# Patient Record
Sex: Male | Born: 1980
Health system: Southern US, Community
[De-identification: ages and names within clinical notes are randomized; demographics above are authoritative.]

## PROBLEM LIST (undated history)

## (undated) DIAGNOSIS — Z21 Asymptomatic human immunodeficiency virus [HIV] infection status: Secondary | ICD-10-CM

---

## 2017-11-29 DIAGNOSIS — M8580 Other specified disorders of bone density and structure, unspecified site: Secondary | ICD-10-CM | POA: Diagnosis not present

## 2017-11-29 DIAGNOSIS — A529 Late syphilis, unspecified: Secondary | ICD-10-CM | POA: Diagnosis not present

## 2017-11-29 DIAGNOSIS — B20 Human immunodeficiency virus [HIV] disease: Secondary | ICD-10-CM | POA: Diagnosis not present

## 2017-12-29 DIAGNOSIS — M76822 Posterior tibial tendinitis, left leg: Secondary | ICD-10-CM | POA: Diagnosis not present

## 2017-12-29 DIAGNOSIS — M2140 Flat foot [pes planus] (acquired), unspecified foot: Secondary | ICD-10-CM | POA: Diagnosis not present

## 2017-12-29 DIAGNOSIS — M65872 Other synovitis and tenosynovitis, left ankle and foot: Secondary | ICD-10-CM | POA: Diagnosis not present

## 2018-02-23 DIAGNOSIS — B20 Human immunodeficiency virus [HIV] disease: Secondary | ICD-10-CM | POA: Diagnosis not present

## 2018-02-23 DIAGNOSIS — M8580 Other specified disorders of bone density and structure, unspecified site: Secondary | ICD-10-CM | POA: Diagnosis not present

## 2018-02-23 DIAGNOSIS — I10 Essential (primary) hypertension: Secondary | ICD-10-CM | POA: Diagnosis not present

## 2018-02-24 DIAGNOSIS — F17290 Nicotine dependence, other tobacco product, uncomplicated: Secondary | ICD-10-CM | POA: Diagnosis not present

## 2018-02-24 DIAGNOSIS — G43909 Migraine, unspecified, not intractable, without status migrainosus: Secondary | ICD-10-CM | POA: Diagnosis not present

## 2018-02-24 DIAGNOSIS — Z6833 Body mass index (BMI) 33.0-33.9, adult: Secondary | ICD-10-CM | POA: Diagnosis not present

## 2018-02-24 DIAGNOSIS — I10 Essential (primary) hypertension: Secondary | ICD-10-CM | POA: Diagnosis not present

## 2018-05-25 DIAGNOSIS — B20 Human immunodeficiency virus [HIV] disease: Secondary | ICD-10-CM | POA: Diagnosis not present

## 2018-05-25 DIAGNOSIS — M8580 Other specified disorders of bone density and structure, unspecified site: Secondary | ICD-10-CM | POA: Diagnosis not present

## 2018-05-25 DIAGNOSIS — R768 Other specified abnormal immunological findings in serum: Secondary | ICD-10-CM | POA: Diagnosis not present

## 2018-05-25 DIAGNOSIS — I10 Essential (primary) hypertension: Secondary | ICD-10-CM | POA: Diagnosis not present

## 2018-09-07 DIAGNOSIS — B20 Human immunodeficiency virus [HIV] disease: Secondary | ICD-10-CM | POA: Diagnosis not present

## 2018-11-03 DIAGNOSIS — I10 Essential (primary) hypertension: Secondary | ICD-10-CM | POA: Diagnosis not present

## 2018-11-03 DIAGNOSIS — F4310 Post-traumatic stress disorder, unspecified: Secondary | ICD-10-CM | POA: Diagnosis not present

## 2018-11-03 DIAGNOSIS — E559 Vitamin D deficiency, unspecified: Secondary | ICD-10-CM | POA: Diagnosis not present

## 2018-11-03 DIAGNOSIS — E669 Obesity, unspecified: Secondary | ICD-10-CM | POA: Diagnosis not present

## 2018-11-03 DIAGNOSIS — Z1159 Encounter for screening for other viral diseases: Secondary | ICD-10-CM | POA: Diagnosis not present

## 2018-11-03 DIAGNOSIS — Z21 Asymptomatic human immunodeficiency virus [HIV] infection status: Secondary | ICD-10-CM | POA: Diagnosis not present

## 2018-11-03 DIAGNOSIS — Z713 Dietary counseling and surveillance: Secondary | ICD-10-CM | POA: Diagnosis not present

## 2018-11-03 DIAGNOSIS — Z6834 Body mass index (BMI) 34.0-34.9, adult: Secondary | ICD-10-CM | POA: Diagnosis not present

## 2019-03-02 DIAGNOSIS — Z119 Encounter for screening for infectious and parasitic diseases, unspecified: Secondary | ICD-10-CM | POA: Diagnosis not present

## 2019-03-02 DIAGNOSIS — Z13228 Encounter for screening for other metabolic disorders: Secondary | ICD-10-CM | POA: Diagnosis not present

## 2019-04-04 DIAGNOSIS — B20 Human immunodeficiency virus [HIV] disease: Secondary | ICD-10-CM | POA: Diagnosis not present

## 2019-04-04 DIAGNOSIS — M25375 Other instability, left foot: Secondary | ICD-10-CM | POA: Diagnosis not present

## 2019-04-04 DIAGNOSIS — M25552 Pain in left hip: Secondary | ICD-10-CM | POA: Diagnosis not present

## 2019-04-04 DIAGNOSIS — K219 Gastro-esophageal reflux disease without esophagitis: Secondary | ICD-10-CM | POA: Diagnosis not present

## 2019-05-29 DIAGNOSIS — F4310 Post-traumatic stress disorder, unspecified: Secondary | ICD-10-CM | POA: Diagnosis not present

## 2019-05-29 DIAGNOSIS — G4700 Insomnia, unspecified: Secondary | ICD-10-CM | POA: Diagnosis not present

## 2019-05-29 DIAGNOSIS — F419 Anxiety disorder, unspecified: Secondary | ICD-10-CM | POA: Diagnosis not present

## 2019-06-01 DIAGNOSIS — B20 Human immunodeficiency virus [HIV] disease: Secondary | ICD-10-CM | POA: Diagnosis not present

## 2019-06-01 DIAGNOSIS — M8580 Other specified disorders of bone density and structure, unspecified site: Secondary | ICD-10-CM | POA: Diagnosis not present

## 2019-06-01 DIAGNOSIS — I10 Essential (primary) hypertension: Secondary | ICD-10-CM | POA: Diagnosis not present

## 2019-06-25 DIAGNOSIS — S8002XA Contusion of left knee, initial encounter: Secondary | ICD-10-CM | POA: Diagnosis not present

## 2019-06-25 DIAGNOSIS — S060X0A Concussion without loss of consciousness, initial encounter: Secondary | ICD-10-CM | POA: Diagnosis not present

## 2019-07-02 DIAGNOSIS — I10 Essential (primary) hypertension: Secondary | ICD-10-CM | POA: Diagnosis not present

## 2019-07-02 DIAGNOSIS — Z7289 Other problems related to lifestyle: Secondary | ICD-10-CM | POA: Diagnosis not present

## 2019-07-02 DIAGNOSIS — G4733 Obstructive sleep apnea (adult) (pediatric): Secondary | ICD-10-CM | POA: Diagnosis not present

## 2019-07-02 DIAGNOSIS — K219 Gastro-esophageal reflux disease without esophagitis: Secondary | ICD-10-CM | POA: Diagnosis not present

## 2019-07-13 ENCOUNTER — Other Ambulatory Visit (HOSPITAL_BASED_OUTPATIENT_CLINIC_OR_DEPARTMENT_OTHER): Payer: Self-pay

## 2019-07-13 DIAGNOSIS — G4733 Obstructive sleep apnea (adult) (pediatric): Secondary | ICD-10-CM

## 2019-07-13 DIAGNOSIS — R0683 Snoring: Secondary | ICD-10-CM

## 2019-07-13 DIAGNOSIS — R5383 Other fatigue: Secondary | ICD-10-CM

## 2019-07-13 DIAGNOSIS — G4710 Hypersomnia, unspecified: Secondary | ICD-10-CM

## 2019-07-17 DIAGNOSIS — Z800 Family history of malignant neoplasm of digestive organs: Secondary | ICD-10-CM | POA: Diagnosis not present

## 2019-07-17 DIAGNOSIS — K219 Gastro-esophageal reflux disease without esophagitis: Secondary | ICD-10-CM | POA: Diagnosis not present

## 2019-07-17 DIAGNOSIS — R1310 Dysphagia, unspecified: Secondary | ICD-10-CM | POA: Diagnosis not present

## 2019-07-17 DIAGNOSIS — D509 Iron deficiency anemia, unspecified: Secondary | ICD-10-CM | POA: Diagnosis not present

## 2019-07-23 DIAGNOSIS — B3781 Candidal esophagitis: Secondary | ICD-10-CM | POA: Diagnosis not present

## 2019-07-23 DIAGNOSIS — R1310 Dysphagia, unspecified: Secondary | ICD-10-CM | POA: Diagnosis not present

## 2019-07-23 DIAGNOSIS — K219 Gastro-esophageal reflux disease without esophagitis: Secondary | ICD-10-CM | POA: Diagnosis not present

## 2019-07-23 DIAGNOSIS — Z800 Family history of malignant neoplasm of digestive organs: Secondary | ICD-10-CM | POA: Diagnosis not present

## 2019-08-08 ENCOUNTER — Other Ambulatory Visit: Payer: Self-pay

## 2019-08-08 ENCOUNTER — Ambulatory Visit (HOSPITAL_BASED_OUTPATIENT_CLINIC_OR_DEPARTMENT_OTHER): Payer: Federal, State, Local not specified - PPO | Attending: Internal Medicine | Admitting: Internal Medicine

## 2019-08-08 DIAGNOSIS — G4710 Hypersomnia, unspecified: Secondary | ICD-10-CM

## 2019-08-08 DIAGNOSIS — G4733 Obstructive sleep apnea (adult) (pediatric): Secondary | ICD-10-CM | POA: Diagnosis not present

## 2019-08-08 DIAGNOSIS — R5383 Other fatigue: Secondary | ICD-10-CM | POA: Diagnosis not present

## 2019-08-08 DIAGNOSIS — R0683 Snoring: Secondary | ICD-10-CM

## 2019-08-11 DIAGNOSIS — G4733 Obstructive sleep apnea (adult) (pediatric): Secondary | ICD-10-CM

## 2019-08-11 NOTE — Procedures (Signed)
   Patient Name: Wesley Hill, Wesley Hill Date: 08/09/2019 Gender: Male D.O.B: 30-Jan-1981 Age (years): 37 Referring Provider: Latanya Presser MD Height (inches): 9 Interpreting Physician: Baird Lyons MD, ABSM Weight (lbs): 185 RPSGT: Jonna Coup BMI: 29 MRN: [id number] Neck Size: 15.50  CLINICAL INFORMATION Sleep Study Type: HST Indication for sleep study: Excessive Daytime Sleepiness, Fatigue, OSA, Snoring Epworth Sleepiness Score: 15  SLEEP STUDY TECHNIQUE A multi-channel overnight portable sleep study was performed. The channels recorded were: nasal airflow, thoracic respiratory movement, and oxygen saturation with a pulse oximetry. Snoring was also monitored.  MEDICATIONS Patient self administered medications include: none reported.  SLEEP ARCHITECTURE Patient was studied for 358.3 minutes. The sleep efficiency was 98.2 % and the patient was supine for 51%. The arousal index was 0.0 per hour.  RESPIRATORY PARAMETERS The overall AHI was 1.0 per hour, with a central apnea index of 0.0 per hour. The oxygen nadir was 90% during sleep.  CARDIAC DATA Mean heart rate during sleep was 54.2 bpm.  IMPRESSIONS - No significant obstructive sleep apnea occurred during this study (AHI = 1.0/h). - No significant central sleep apnea occurred during this study (CAI = 0.0/h). - The patient had minimal or no oxygen desaturation during the study (Min O2 = 90%) - Patient snored.  DIAGNOSIS - Normal study  RECOMMENDATIONS - Be careful with alcohol, sedatives and other CNS depressants that may worsen sleep apnea and disrupt normal sleep architecture. - Sleep hygiene should be reviewed to assess factors that may improve sleep quality. - Weight management and regular exercise should be initiated or continued. - Consider repeat HST or in-lab study if test results are discordant with clinical impresion.  [Electronically signed] 08/11/2019 11:04 AM  Baird Lyons MD, ABSM Diplomate,  American Board of Sleep Medicine   NPI: [id number]                          [place] [place] [facility]  ELECTRONICALLY SIGNED ON:  08/11/2019, 11:01 AM [place] PH: [phone]   FX: [phone] [place]

## 2019-08-14 DIAGNOSIS — M2142 Flat foot [pes planus] (acquired), left foot: Secondary | ICD-10-CM | POA: Diagnosis not present

## 2019-08-14 DIAGNOSIS — M25572 Pain in left ankle and joints of left foot: Secondary | ICD-10-CM | POA: Diagnosis not present

## 2019-08-14 DIAGNOSIS — B353 Tinea pedis: Secondary | ICD-10-CM | POA: Diagnosis not present

## 2019-08-23 DIAGNOSIS — K200 Eosinophilic esophagitis: Secondary | ICD-10-CM | POA: Diagnosis not present

## 2019-08-23 DIAGNOSIS — K219 Gastro-esophageal reflux disease without esophagitis: Secondary | ICD-10-CM | POA: Diagnosis not present

## 2019-08-30 DIAGNOSIS — R768 Other specified abnormal immunological findings in serum: Secondary | ICD-10-CM | POA: Diagnosis not present

## 2019-08-30 DIAGNOSIS — Z79899 Other long term (current) drug therapy: Secondary | ICD-10-CM | POA: Diagnosis not present

## 2019-08-30 DIAGNOSIS — B20 Human immunodeficiency virus [HIV] disease: Secondary | ICD-10-CM | POA: Diagnosis not present

## 2019-08-30 DIAGNOSIS — Z8619 Personal history of other infectious and parasitic diseases: Secondary | ICD-10-CM | POA: Diagnosis not present

## 2019-09-05 DIAGNOSIS — M65872 Other synovitis and tenosynovitis, left ankle and foot: Secondary | ICD-10-CM | POA: Diagnosis not present

## 2019-09-05 DIAGNOSIS — S93312A Subluxation of tarsal joint of left foot, initial encounter: Secondary | ICD-10-CM | POA: Diagnosis not present

## 2019-09-05 DIAGNOSIS — M24152 Other articular cartilage disorders, left hip: Secondary | ICD-10-CM | POA: Diagnosis not present

## 2019-09-05 DIAGNOSIS — M259 Joint disorder, unspecified: Secondary | ICD-10-CM | POA: Diagnosis not present

## 2019-09-05 DIAGNOSIS — M85872 Other specified disorders of bone density and structure, left ankle and foot: Secondary | ICD-10-CM | POA: Diagnosis not present

## 2019-09-05 DIAGNOSIS — M542 Cervicalgia: Secondary | ICD-10-CM | POA: Diagnosis not present

## 2019-09-05 DIAGNOSIS — M549 Dorsalgia, unspecified: Secondary | ICD-10-CM | POA: Diagnosis not present

## 2019-09-05 DIAGNOSIS — R600 Localized edema: Secondary | ICD-10-CM | POA: Diagnosis not present

## 2019-09-05 DIAGNOSIS — M25472 Effusion, left ankle: Secondary | ICD-10-CM | POA: Diagnosis not present

## 2019-09-05 DIAGNOSIS — M419 Scoliosis, unspecified: Secondary | ICD-10-CM | POA: Diagnosis not present

## 2019-09-05 DIAGNOSIS — M25552 Pain in left hip: Secondary | ICD-10-CM | POA: Diagnosis not present

## 2019-09-05 DIAGNOSIS — M160 Bilateral primary osteoarthritis of hip: Secondary | ICD-10-CM | POA: Diagnosis not present

## 2019-09-05 DIAGNOSIS — M899 Disorder of bone, unspecified: Secondary | ICD-10-CM | POA: Diagnosis not present

## 2020-01-18 DIAGNOSIS — Z1389 Encounter for screening for other disorder: Secondary | ICD-10-CM | POA: Diagnosis not present

## 2020-01-18 DIAGNOSIS — B20 Human immunodeficiency virus [HIV] disease: Secondary | ICD-10-CM | POA: Diagnosis not present

## 2020-02-05 ENCOUNTER — Other Ambulatory Visit: Payer: Self-pay | Admitting: Internal Medicine

## 2020-02-05 DIAGNOSIS — R1031 Right lower quadrant pain: Secondary | ICD-10-CM

## 2020-02-05 DIAGNOSIS — B20 Human immunodeficiency virus [HIV] disease: Secondary | ICD-10-CM | POA: Diagnosis not present

## 2020-02-05 DIAGNOSIS — I10 Essential (primary) hypertension: Secondary | ICD-10-CM | POA: Diagnosis not present

## 2020-02-05 DIAGNOSIS — R109 Unspecified abdominal pain: Secondary | ICD-10-CM | POA: Diagnosis not present

## 2020-02-05 DIAGNOSIS — R3129 Other microscopic hematuria: Secondary | ICD-10-CM | POA: Diagnosis not present

## 2020-02-06 ENCOUNTER — Ambulatory Visit
Admission: RE | Admit: 2020-02-06 | Discharge: 2020-02-06 | Disposition: A | Discharge status: Home / Self Care | Payer: Federal, State, Local not specified - PPO | Source: Ambulatory Visit | Attending: Internal Medicine | Admitting: Internal Medicine

## 2020-02-06 DIAGNOSIS — R1031 Right lower quadrant pain: Secondary | ICD-10-CM

## 2020-02-06 MED ORDER — IOPAMIDOL (ISOVUE-300) INJECTION 61%
125.0000 mL | Freq: Once | INTRAVENOUS | Status: AC | PRN
  Administered 2020-02-06: 13:00:00 125 mL via INTRAVENOUS

## 2020-02-07 DIAGNOSIS — Z7252 High risk homosexual behavior: Secondary | ICD-10-CM | POA: Diagnosis not present

## 2020-02-07 DIAGNOSIS — B20 Human immunodeficiency virus [HIV] disease: Secondary | ICD-10-CM | POA: Diagnosis not present

## 2020-02-07 DIAGNOSIS — K6289 Other specified diseases of anus and rectum: Secondary | ICD-10-CM | POA: Diagnosis not present

## 2020-02-12 DIAGNOSIS — B20 Human immunodeficiency virus [HIV] disease: Secondary | ICD-10-CM | POA: Diagnosis not present

## 2020-02-12 DIAGNOSIS — K625 Hemorrhage of anus and rectum: Secondary | ICD-10-CM | POA: Diagnosis not present

## 2020-02-12 DIAGNOSIS — R194 Change in bowel habit: Secondary | ICD-10-CM | POA: Diagnosis not present

## 2020-02-12 DIAGNOSIS — R933 Abnormal findings on diagnostic imaging of other parts of digestive tract: Secondary | ICD-10-CM | POA: Diagnosis not present

## 2020-02-15 DIAGNOSIS — Z1211 Encounter for screening for malignant neoplasm of colon: Secondary | ICD-10-CM | POA: Diagnosis not present

## 2020-02-15 DIAGNOSIS — R194 Change in bowel habit: Secondary | ICD-10-CM | POA: Diagnosis not present

## 2020-02-15 DIAGNOSIS — K625 Hemorrhage of anus and rectum: Secondary | ICD-10-CM | POA: Diagnosis not present

## 2020-02-15 DIAGNOSIS — R933 Abnormal findings on diagnostic imaging of other parts of digestive tract: Secondary | ICD-10-CM | POA: Diagnosis not present

## 2020-02-22 DIAGNOSIS — R519 Headache, unspecified: Secondary | ICD-10-CM | POA: Diagnosis not present

## 2020-04-17 DIAGNOSIS — M8580 Other specified disorders of bone density and structure, unspecified site: Secondary | ICD-10-CM | POA: Diagnosis not present

## 2020-04-17 DIAGNOSIS — Z8619 Personal history of other infectious and parasitic diseases: Secondary | ICD-10-CM | POA: Diagnosis not present

## 2020-04-17 DIAGNOSIS — B20 Human immunodeficiency virus [HIV] disease: Secondary | ICD-10-CM | POA: Diagnosis not present

## 2020-04-17 DIAGNOSIS — R768 Other specified abnormal immunological findings in serum: Secondary | ICD-10-CM | POA: Diagnosis not present

## 2020-04-17 DIAGNOSIS — Z79899 Other long term (current) drug therapy: Secondary | ICD-10-CM | POA: Diagnosis not present

## 2020-10-30 DIAGNOSIS — B20 Human immunodeficiency virus [HIV] disease: Secondary | ICD-10-CM | POA: Diagnosis not present

## 2020-10-30 DIAGNOSIS — M8580 Other specified disorders of bone density and structure, unspecified site: Secondary | ICD-10-CM | POA: Diagnosis not present

## 2020-10-30 DIAGNOSIS — I10 Essential (primary) hypertension: Secondary | ICD-10-CM | POA: Diagnosis not present

## 2020-10-30 DIAGNOSIS — N289 Disorder of kidney and ureter, unspecified: Secondary | ICD-10-CM | POA: Diagnosis not present

## 2020-11-25 IMAGING — CT CT ABD-PELV W/ CM
2 of 4 series · 14 of 46 positions shown, 16 images · IV contrast (iopamidol)
Comparison: None.

CLINICAL DATA: Right lower quadrant pain, suprapubic pain, pain and
cramping for 3 days, evaluate for colitis or appendicitis.

EXAM:
CT ABDOMEN AND PELVIS WITH CONTRAST
TECHNIQUE: Multidetector CT imaging of the abdomen and pelvis was performed
using the standard protocol following bolus administration of
intravenous contrast.
CONTRAST:  125mL K16219-5MM IOPAMIDOL (K16219-5MM) INJECTION 61%

[Series 2: abd pelvis 5.00 br40 s3 axial · axial · 0.66mm/px · z∈[+1274,+1684]mm · 11 of 100 slices shown, 13 images]
[im 9/100  soft-tissue]
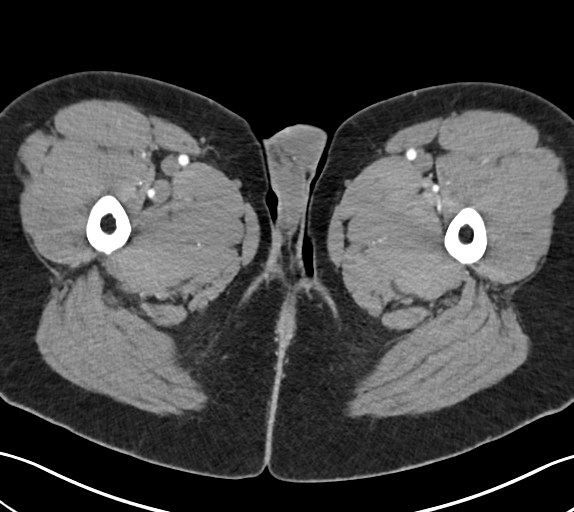
[im 9/100  bone]
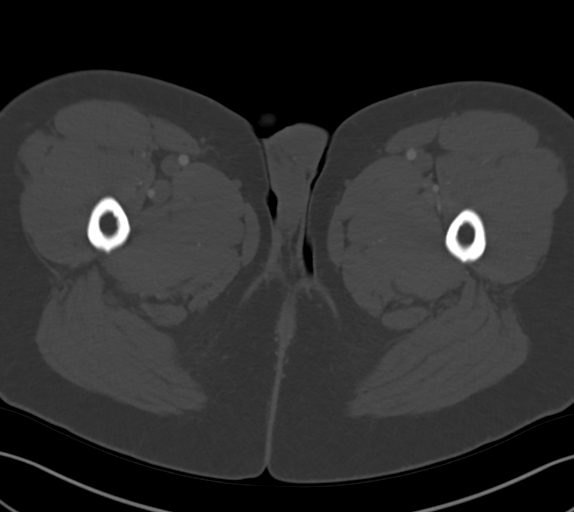
[im 17/100  soft-tissue]
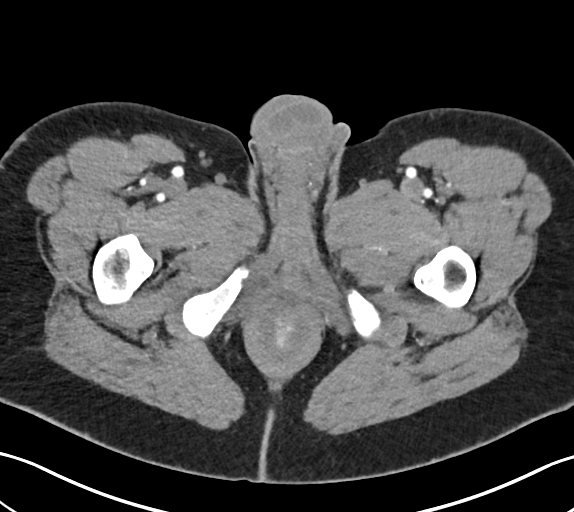
[im 25/100  soft-tissue]
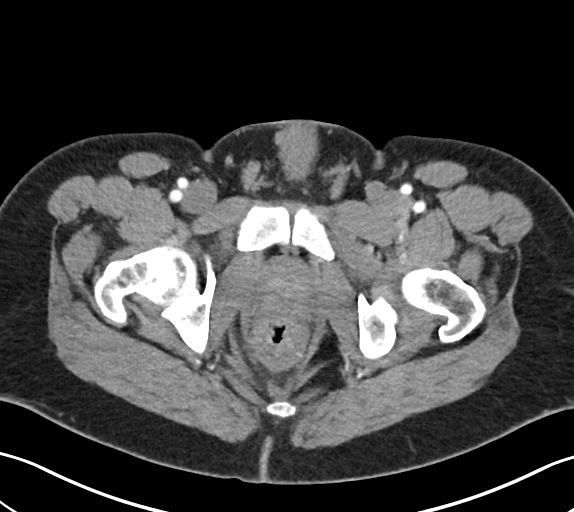
[im 34/100  soft-tissue]
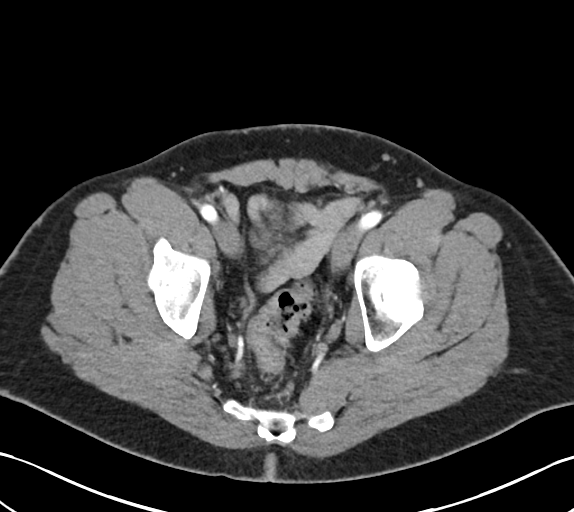
[im 42/100  soft-tissue]
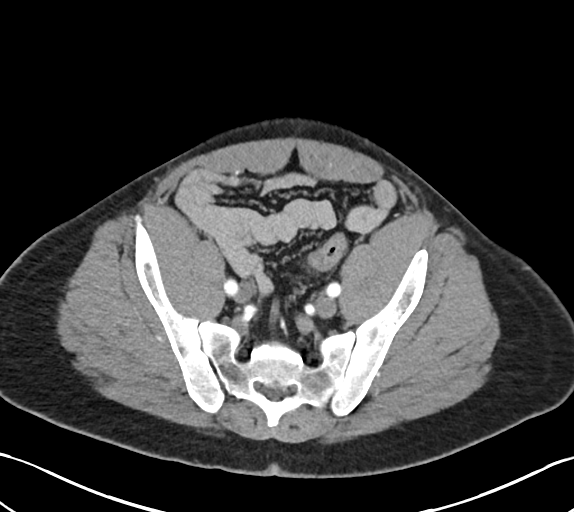
[im 50/100  soft-tissue]
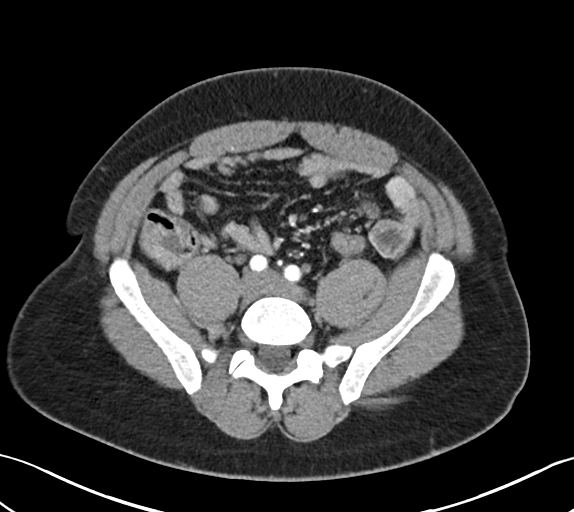
[im 58/100  soft-tissue]
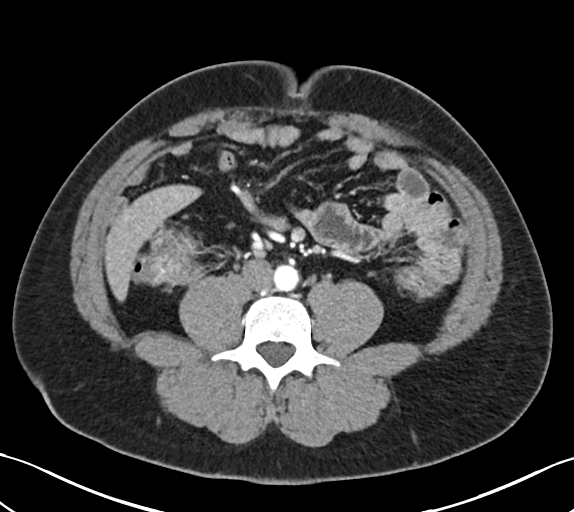
[im 67/100  soft-tissue]
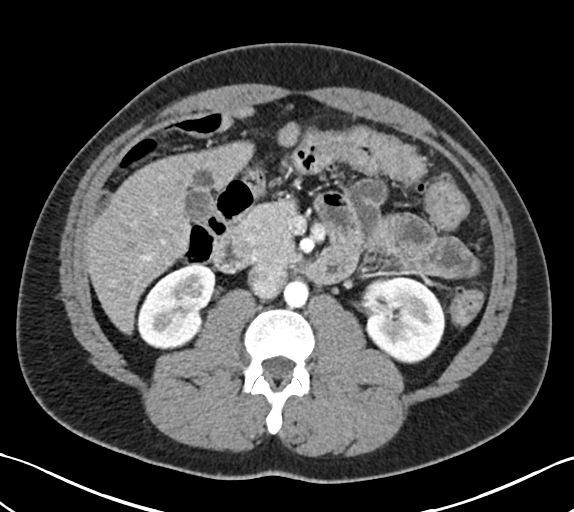
[im 75/100  soft-tissue]
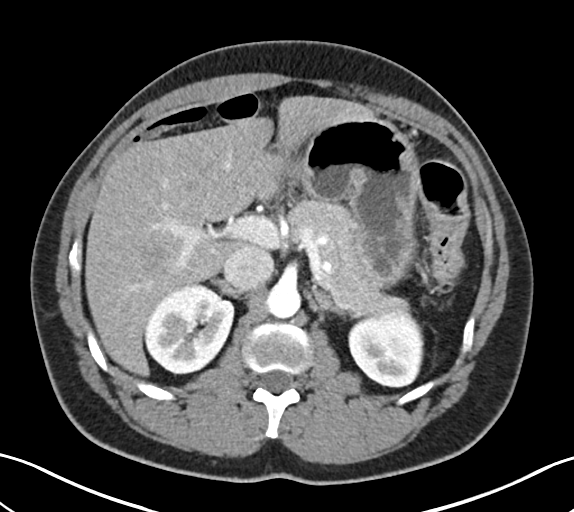
[im 75/100  bone]
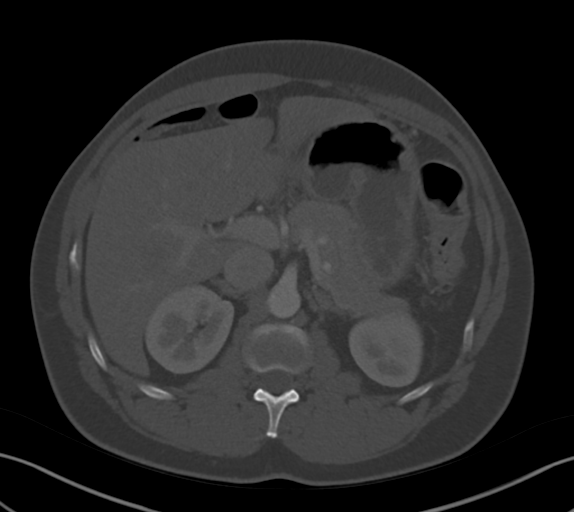
[im 83/100  soft-tissue]
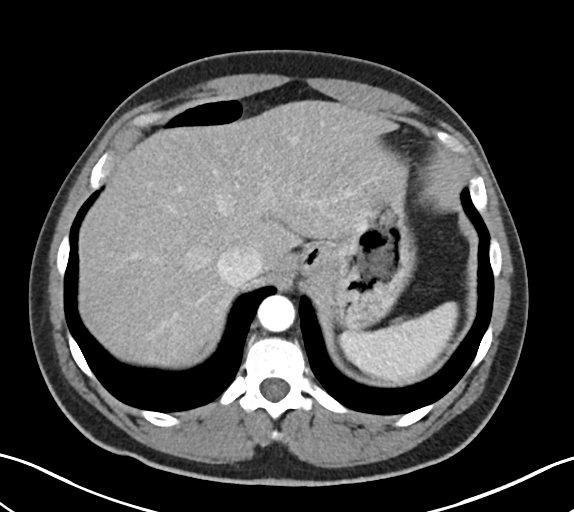
[im 91/100  soft-tissue]
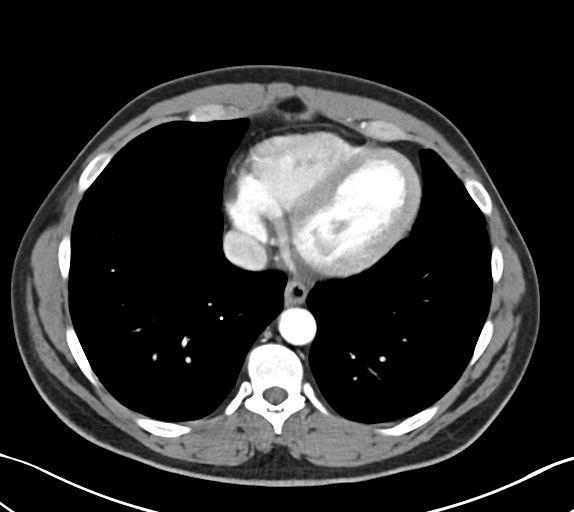

[Series 6: abd pelvis 2.00 br40 s3 cor · coronal · 0.74mm/px · 3 of 167 slices shown]
[im 56/167  soft-tissue]
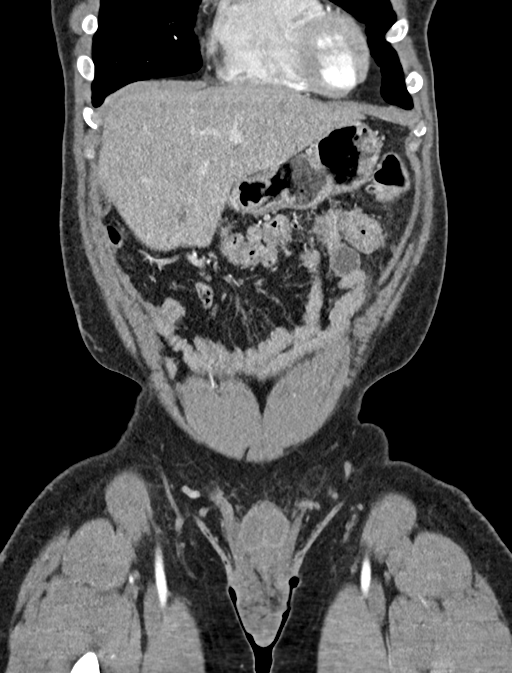
[im 74/167  soft-tissue]
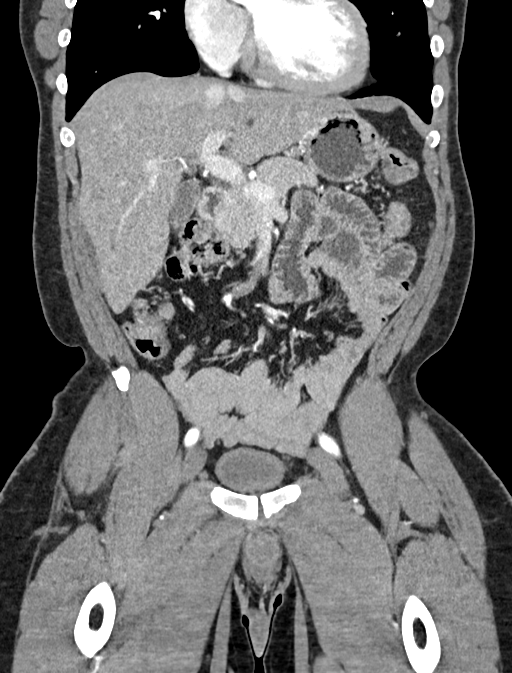
[im 93/167  soft-tissue]
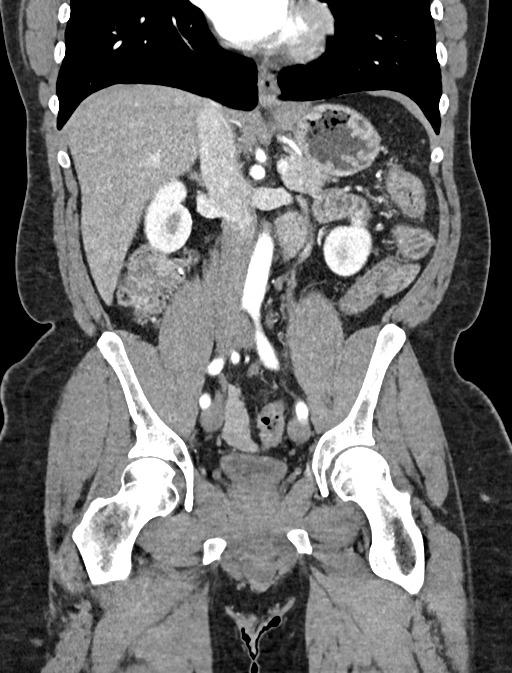

[14 of 46 positions shown; findings below may reference images not displayed]

FINDINGS: Lower chest: No acute abnormality.

Hepatobiliary: No solid liver abnormality is seen. No gallstones,
gallbladder wall thickening, or biliary dilatation.

Pancreas: Unremarkable. No pancreatic ductal dilatation or
surrounding inflammatory changes.

Spleen: Normal in size without significant abnormality.

Adrenals/Urinary Tract: Adrenal glands are unremarkable. Kidneys are
normal, without renal calculi, solid lesion, or hydronephrosis.
Bladder is unremarkable.

Stomach/Bowel: Stomach is within normal limits. Appendix appears
normal. There is circumferential inflammatory thickening and
vascular combing about the rectum (series 2, image 75, series 8,
image 92). There may be additional areas of inflammatory thickening,
for example in the transverse colon (series 2, image 36).

Vascular/Lymphatic: No significant vascular findings are present. No
enlarged abdominal or pelvic lymph nodes.

Reproductive: No mass or other significant abnormality.

Other: No abdominal wall hernia or abnormality. No abdominopelvic
ascites.

Musculoskeletal: No acute or significant osseous findings.
IMPRESSION: 1. There is circumferential inflammatory thickening and vascular
combing about the rectum, and there may be additional areas of
inflammatory thickening, for example in the transverse colon.
Findings are consistent with nonspecific infectious or inflammatory
proctocolitis, and differential considerations include ulcerative
colitis given this appearance.

2.  Normal appendix.

## 2021-04-29 DIAGNOSIS — Z6832 Body mass index (BMI) 32.0-32.9, adult: Secondary | ICD-10-CM | POA: Diagnosis not present

## 2021-04-29 DIAGNOSIS — F411 Generalized anxiety disorder: Secondary | ICD-10-CM | POA: Diagnosis not present

## 2021-04-29 DIAGNOSIS — F329 Major depressive disorder, single episode, unspecified: Secondary | ICD-10-CM | POA: Diagnosis not present

## 2021-04-29 DIAGNOSIS — G43109 Migraine with aura, not intractable, without status migrainosus: Secondary | ICD-10-CM | POA: Diagnosis not present

## 2021-04-29 DIAGNOSIS — I10 Essential (primary) hypertension: Secondary | ICD-10-CM | POA: Diagnosis not present

## 2021-04-29 DIAGNOSIS — M25562 Pain in left knee: Secondary | ICD-10-CM | POA: Diagnosis not present

## 2021-04-29 DIAGNOSIS — Z0001 Encounter for general adult medical examination with abnormal findings: Secondary | ICD-10-CM | POA: Diagnosis not present

## 2021-04-30 DIAGNOSIS — M8580 Other specified disorders of bone density and structure, unspecified site: Secondary | ICD-10-CM | POA: Diagnosis not present

## 2021-04-30 DIAGNOSIS — I10 Essential (primary) hypertension: Secondary | ICD-10-CM | POA: Diagnosis not present

## 2021-04-30 DIAGNOSIS — B20 Human immunodeficiency virus [HIV] disease: Secondary | ICD-10-CM | POA: Diagnosis not present

## 2021-04-30 DIAGNOSIS — N289 Disorder of kidney and ureter, unspecified: Secondary | ICD-10-CM | POA: Diagnosis not present

## 2021-06-09 DIAGNOSIS — B20 Human immunodeficiency virus [HIV] disease: Secondary | ICD-10-CM | POA: Diagnosis not present

## 2021-06-09 DIAGNOSIS — K200 Eosinophilic esophagitis: Secondary | ICD-10-CM | POA: Diagnosis not present

## 2021-06-09 DIAGNOSIS — K6289 Other specified diseases of anus and rectum: Secondary | ICD-10-CM | POA: Diagnosis not present

## 2021-06-09 DIAGNOSIS — I10 Essential (primary) hypertension: Secondary | ICD-10-CM | POA: Diagnosis not present

## 2021-07-14 DIAGNOSIS — R030 Elevated blood-pressure reading, without diagnosis of hypertension: Secondary | ICD-10-CM | POA: Diagnosis not present

## 2021-07-14 DIAGNOSIS — R0789 Other chest pain: Secondary | ICD-10-CM | POA: Diagnosis not present

## 2021-07-14 DIAGNOSIS — F17200 Nicotine dependence, unspecified, uncomplicated: Secondary | ICD-10-CM | POA: Diagnosis not present

## 2021-07-14 DIAGNOSIS — Z6834 Body mass index (BMI) 34.0-34.9, adult: Secondary | ICD-10-CM | POA: Diagnosis not present

## 2021-08-07 ENCOUNTER — Other Ambulatory Visit (HOSPITAL_BASED_OUTPATIENT_CLINIC_OR_DEPARTMENT_OTHER): Payer: Self-pay

## 2021-08-07 DIAGNOSIS — G4733 Obstructive sleep apnea (adult) (pediatric): Secondary | ICD-10-CM

## 2021-10-07 DIAGNOSIS — E785 Hyperlipidemia, unspecified: Secondary | ICD-10-CM | POA: Diagnosis not present

## 2021-10-07 DIAGNOSIS — K200 Eosinophilic esophagitis: Secondary | ICD-10-CM | POA: Diagnosis not present

## 2021-10-07 DIAGNOSIS — E669 Obesity, unspecified: Secondary | ICD-10-CM | POA: Diagnosis not present

## 2021-10-07 DIAGNOSIS — I10 Essential (primary) hypertension: Secondary | ICD-10-CM | POA: Diagnosis not present

## 2021-10-09 DIAGNOSIS — I10 Essential (primary) hypertension: Secondary | ICD-10-CM | POA: Diagnosis not present

## 2021-10-09 DIAGNOSIS — E669 Obesity, unspecified: Secondary | ICD-10-CM | POA: Diagnosis not present

## 2021-10-09 DIAGNOSIS — R61 Generalized hyperhidrosis: Secondary | ICD-10-CM | POA: Diagnosis not present

## 2021-10-09 DIAGNOSIS — E785 Hyperlipidemia, unspecified: Secondary | ICD-10-CM | POA: Diagnosis not present

## 2021-10-15 DIAGNOSIS — I10 Essential (primary) hypertension: Secondary | ICD-10-CM | POA: Diagnosis not present

## 2021-10-15 DIAGNOSIS — B20 Human immunodeficiency virus [HIV] disease: Secondary | ICD-10-CM | POA: Diagnosis not present

## 2021-10-15 DIAGNOSIS — M8580 Other specified disorders of bone density and structure, unspecified site: Secondary | ICD-10-CM | POA: Diagnosis not present

## 2021-10-15 DIAGNOSIS — Z23 Encounter for immunization: Secondary | ICD-10-CM | POA: Diagnosis not present

## 2021-10-15 DIAGNOSIS — R768 Other specified abnormal immunological findings in serum: Secondary | ICD-10-CM | POA: Diagnosis not present

## 2021-12-15 DIAGNOSIS — K219 Gastro-esophageal reflux disease without esophagitis: Secondary | ICD-10-CM | POA: Diagnosis not present

## 2021-12-15 DIAGNOSIS — K200 Eosinophilic esophagitis: Secondary | ICD-10-CM | POA: Diagnosis not present

## 2021-12-15 DIAGNOSIS — B20 Human immunodeficiency virus [HIV] disease: Secondary | ICD-10-CM | POA: Diagnosis not present

## 2022-01-15 DIAGNOSIS — D649 Anemia, unspecified: Secondary | ICD-10-CM | POA: Diagnosis not present

## 2022-01-15 DIAGNOSIS — E785 Hyperlipidemia, unspecified: Secondary | ICD-10-CM | POA: Diagnosis not present

## 2022-01-15 DIAGNOSIS — E559 Vitamin D deficiency, unspecified: Secondary | ICD-10-CM | POA: Diagnosis not present

## 2022-01-20 DIAGNOSIS — G4733 Obstructive sleep apnea (adult) (pediatric): Secondary | ICD-10-CM | POA: Diagnosis not present

## 2022-01-20 DIAGNOSIS — I10 Essential (primary) hypertension: Secondary | ICD-10-CM | POA: Diagnosis not present

## 2022-01-20 DIAGNOSIS — E559 Vitamin D deficiency, unspecified: Secondary | ICD-10-CM | POA: Diagnosis not present

## 2022-01-20 DIAGNOSIS — E785 Hyperlipidemia, unspecified: Secondary | ICD-10-CM | POA: Diagnosis not present

## 2022-04-22 DIAGNOSIS — M8580 Other specified disorders of bone density and structure, unspecified site: Secondary | ICD-10-CM | POA: Diagnosis not present

## 2022-04-22 DIAGNOSIS — I10 Essential (primary) hypertension: Secondary | ICD-10-CM | POA: Diagnosis not present

## 2022-04-22 DIAGNOSIS — B20 Human immunodeficiency virus [HIV] disease: Secondary | ICD-10-CM | POA: Diagnosis not present

## 2022-06-25 DIAGNOSIS — M79605 Pain in left leg: Secondary | ICD-10-CM | POA: Diagnosis not present

## 2022-06-25 DIAGNOSIS — M549 Dorsalgia, unspecified: Secondary | ICD-10-CM | POA: Diagnosis not present

## 2022-06-25 DIAGNOSIS — M25519 Pain in unspecified shoulder: Secondary | ICD-10-CM | POA: Diagnosis not present

## 2022-06-29 DIAGNOSIS — E785 Hyperlipidemia, unspecified: Secondary | ICD-10-CM | POA: Diagnosis not present

## 2022-06-29 DIAGNOSIS — D649 Anemia, unspecified: Secondary | ICD-10-CM | POA: Diagnosis not present

## 2022-06-29 DIAGNOSIS — E559 Vitamin D deficiency, unspecified: Secondary | ICD-10-CM | POA: Diagnosis not present

## 2022-06-29 DIAGNOSIS — Z0001 Encounter for general adult medical examination with abnormal findings: Secondary | ICD-10-CM | POA: Diagnosis not present

## 2022-07-09 DIAGNOSIS — Z23 Encounter for immunization: Secondary | ICD-10-CM | POA: Diagnosis not present

## 2022-07-09 DIAGNOSIS — N182 Chronic kidney disease, stage 2 (mild): Secondary | ICD-10-CM | POA: Diagnosis not present

## 2022-07-09 DIAGNOSIS — Z6832 Body mass index (BMI) 32.0-32.9, adult: Secondary | ICD-10-CM | POA: Diagnosis not present

## 2022-07-09 DIAGNOSIS — Z0001 Encounter for general adult medical examination with abnormal findings: Secondary | ICD-10-CM | POA: Diagnosis not present

## 2022-07-09 DIAGNOSIS — G4733 Obstructive sleep apnea (adult) (pediatric): Secondary | ICD-10-CM | POA: Diagnosis not present

## 2022-07-09 DIAGNOSIS — I10 Essential (primary) hypertension: Secondary | ICD-10-CM | POA: Diagnosis not present

## 2022-08-02 DIAGNOSIS — R140 Abdominal distension (gaseous): Secondary | ICD-10-CM | POA: Diagnosis not present

## 2022-08-02 DIAGNOSIS — F4310 Post-traumatic stress disorder, unspecified: Secondary | ICD-10-CM | POA: Diagnosis not present

## 2022-08-02 DIAGNOSIS — R143 Flatulence: Secondary | ICD-10-CM | POA: Diagnosis not present

## 2022-08-02 DIAGNOSIS — E559 Vitamin D deficiency, unspecified: Secondary | ICD-10-CM | POA: Diagnosis not present

## 2022-08-02 DIAGNOSIS — K200 Eosinophilic esophagitis: Secondary | ICD-10-CM | POA: Diagnosis not present

## 2022-08-02 DIAGNOSIS — I10 Essential (primary) hypertension: Secondary | ICD-10-CM | POA: Diagnosis not present

## 2022-08-02 DIAGNOSIS — G43909 Migraine, unspecified, not intractable, without status migrainosus: Secondary | ICD-10-CM | POA: Diagnosis not present

## 2022-08-10 ENCOUNTER — Other Ambulatory Visit (HOSPITAL_BASED_OUTPATIENT_CLINIC_OR_DEPARTMENT_OTHER): Payer: Self-pay

## 2022-08-10 DIAGNOSIS — G4730 Sleep apnea, unspecified: Secondary | ICD-10-CM

## 2022-08-10 DIAGNOSIS — R5383 Other fatigue: Secondary | ICD-10-CM

## 2022-08-25 DIAGNOSIS — K219 Gastro-esophageal reflux disease without esophagitis: Secondary | ICD-10-CM | POA: Diagnosis not present

## 2022-08-25 DIAGNOSIS — K200 Eosinophilic esophagitis: Secondary | ICD-10-CM | POA: Diagnosis not present

## 2022-08-25 DIAGNOSIS — B20 Human immunodeficiency virus [HIV] disease: Secondary | ICD-10-CM | POA: Diagnosis not present

## 2022-10-20 ENCOUNTER — Ambulatory Visit (HOSPITAL_BASED_OUTPATIENT_CLINIC_OR_DEPARTMENT_OTHER): Payer: Federal, State, Local not specified - PPO | Attending: Internal Medicine | Admitting: Internal Medicine

## 2022-10-20 DIAGNOSIS — R5383 Other fatigue: Secondary | ICD-10-CM | POA: Diagnosis not present

## 2022-10-20 DIAGNOSIS — R0683 Snoring: Secondary | ICD-10-CM | POA: Diagnosis not present

## 2022-10-20 DIAGNOSIS — G4730 Sleep apnea, unspecified: Secondary | ICD-10-CM

## 2022-10-20 DIAGNOSIS — I493 Ventricular premature depolarization: Secondary | ICD-10-CM | POA: Diagnosis not present

## 2022-10-20 DIAGNOSIS — G4761 Periodic limb movement disorder: Secondary | ICD-10-CM | POA: Insufficient documentation

## 2022-10-21 DIAGNOSIS — R140 Abdominal distension (gaseous): Secondary | ICD-10-CM | POA: Diagnosis not present

## 2022-10-21 DIAGNOSIS — B20 Human immunodeficiency virus [HIV] disease: Secondary | ICD-10-CM | POA: Diagnosis not present

## 2022-10-21 DIAGNOSIS — R5383 Other fatigue: Secondary | ICD-10-CM | POA: Insufficient documentation

## 2022-10-21 DIAGNOSIS — R109 Unspecified abdominal pain: Secondary | ICD-10-CM | POA: Diagnosis not present

## 2022-10-21 DIAGNOSIS — Z79899 Other long term (current) drug therapy: Secondary | ICD-10-CM | POA: Diagnosis not present

## 2022-10-21 DIAGNOSIS — R143 Flatulence: Secondary | ICD-10-CM | POA: Diagnosis not present

## 2022-10-21 DIAGNOSIS — I10 Essential (primary) hypertension: Secondary | ICD-10-CM | POA: Diagnosis not present

## 2022-10-21 DIAGNOSIS — M8580 Other specified disorders of bone density and structure, unspecified site: Secondary | ICD-10-CM | POA: Diagnosis not present

## 2022-10-21 DIAGNOSIS — Z8619 Personal history of other infectious and parasitic diseases: Secondary | ICD-10-CM | POA: Diagnosis not present

## 2022-10-21 DIAGNOSIS — R197 Diarrhea, unspecified: Secondary | ICD-10-CM | POA: Diagnosis not present

## 2022-10-27 DIAGNOSIS — R5383 Other fatigue: Secondary | ICD-10-CM | POA: Diagnosis not present

## 2022-10-27 NOTE — Procedures (Signed)
   Patient Name: Wesley Hill, Armbrust Date: 10/20/2022 Gender: Male D.O.B: 10/02/1981 Age (years): 41 Referring Provider: Jamison Oka MD Height (inches): 67 Interpreting Physician: Jetty Duhamel MD, ABSM Weight (lbs): 199 RPSGT: Lowry Ram BMI: 32 MRN: [id number] Neck Size: 15.50  CLINICAL INFORMATION Sleep Study Type: NPSG Indication for sleep study: Fatigue, Snoring Epworth Sleepiness Score: 15  Most recent polysomnogram dated 08/09/2019 revealed an AHI of 1.0/h and RDI of 1.0/h.  SLEEP STUDY TECHNIQUE As per the AASM Manual for the Scoring of Sleep and Associated Events v2.3 (April 2016) with a hypopnea requiring 4% desaturations.  The channels recorded and monitored were frontal, central and occipital EEG, electrooculogram (EOG), submentalis EMG (chin), nasal and oral airflow, thoracic and abdominal wall motion, anterior tibialis EMG, snore microphone, electrocardiogram, and pulse oximetry.  MEDICATIONS Medications self-administered by patient taken the night of the study : none reported  SLEEP ARCHITECTURE The study was initiated at 10:34:01 PM and ended at 5:36:07 AM.  Sleep onset time was 6.3 minutes and the sleep efficiency was 85.4%. The total sleep time was 360.5 minutes.  Stage REM latency was 46.5 minutes.  The patient spent 6.8% of the night in stage N1 sleep, 63.4% in stage N2 sleep, 0.0% in stage N3 and 29.8% in REM.  Alpha intrusion was absent.  Supine sleep was 7.04%.  RESPIRATORY PARAMETERS The overall apnea/hypopnea index (AHI) was 2.2 per hour. There were 6 total apneas, including 2 obstructive, 4 central and 0 mixed apneas. There were 7 hypopneas and 1 RERAs.  The AHI during Stage REM sleep was 2.2 per hour.  AHI while supine was 9.5 per hour.  The mean oxygen saturation was 96.0%. The minimum SpO2 during sleep was 86.0%.  soft snoring was noted during this study.  CARDIAC DATA The 2 lead EKG demonstrated sinus rhythm. The mean  heart rate was 58.0 beats per minute. Other EKG findings include: PVCs.  LEG MOVEMENT DATA The total PLMS were 0 with a resulting PLMS index of 0.0. Associated arousal with leg movement index was 10.2 .  IMPRESSIONS - No significant obstructive sleep apnea occurred during this study (AHI = 2.2/h). - No significant central sleep apnea occurred during this study (CAI = 0.7/h). - Mild oxygen desaturation was noted during this study (Min O2 = 86.0%). Mean 96% - The patient snored with soft snoring volume. - EKG findings include PVCs. - Clinically significant periodic limb movements did occur during sleep. Total 69 (11.5/ hr). Total limb movements with arousal 61 (10.2/ hr).  DIAGNOSIS - Primary Snoring - Periodic Limb Movement Sleep Disorder  RECOMMENDATIONS - Consider trial of Requip or Mirapex for limb movement sleep disorder, if appropriate. - Be careful with alcohol, sedatives and other CNS depressants that may worsen sleep apnea and disrupt normal sleep architecture. - Sleep hygiene should be reviewed to assess factors that may improve sleep quality. - Weight management and regular exercise should be initiated or continued if appropriate.  [Electronically signed] 10/27/2022 01:42 PM  Jetty Duhamel MD, ABSM Diplomate, American Board of Sleep Medicine NPI: [id number]                          Jetty Duhamel Diplomate, American Board of Sleep Medicine  ELECTRONICALLY SIGNED ON:  10/27/2022, 1:35 PM [facility] PH: [phone]   FX: [phone] ACCREDITED BY THE AMERICAN ACADEMY OF SLEEP MEDICINE

## 2022-11-22 DIAGNOSIS — G4761 Periodic limb movement disorder: Secondary | ICD-10-CM | POA: Diagnosis not present

## 2023-01-14 DIAGNOSIS — E785 Hyperlipidemia, unspecified: Secondary | ICD-10-CM | POA: Diagnosis not present

## 2023-01-14 DIAGNOSIS — G4709 Other insomnia: Secondary | ICD-10-CM | POA: Diagnosis not present

## 2023-01-14 DIAGNOSIS — N182 Chronic kidney disease, stage 2 (mild): Secondary | ICD-10-CM | POA: Diagnosis not present

## 2023-01-14 DIAGNOSIS — G4761 Periodic limb movement disorder: Secondary | ICD-10-CM | POA: Diagnosis not present

## 2023-03-24 DIAGNOSIS — M8580 Other specified disorders of bone density and structure, unspecified site: Secondary | ICD-10-CM | POA: Diagnosis not present

## 2023-03-24 DIAGNOSIS — B20 Human immunodeficiency virus [HIV] disease: Secondary | ICD-10-CM | POA: Diagnosis not present

## 2023-03-24 DIAGNOSIS — R9431 Abnormal electrocardiogram [ECG] [EKG]: Secondary | ICD-10-CM | POA: Diagnosis not present

## 2023-03-24 DIAGNOSIS — A519 Early syphilis, unspecified: Secondary | ICD-10-CM | POA: Diagnosis not present

## 2023-03-24 DIAGNOSIS — R001 Bradycardia, unspecified: Secondary | ICD-10-CM | POA: Diagnosis not present

## 2023-03-24 DIAGNOSIS — I10 Essential (primary) hypertension: Secondary | ICD-10-CM | POA: Diagnosis not present

## 2023-03-24 DIAGNOSIS — I491 Atrial premature depolarization: Secondary | ICD-10-CM | POA: Diagnosis not present

## 2023-07-19 DIAGNOSIS — E559 Vitamin D deficiency, unspecified: Secondary | ICD-10-CM | POA: Diagnosis not present

## 2023-07-19 DIAGNOSIS — D649 Anemia, unspecified: Secondary | ICD-10-CM | POA: Diagnosis not present

## 2023-07-19 DIAGNOSIS — I10 Essential (primary) hypertension: Secondary | ICD-10-CM | POA: Diagnosis not present

## 2023-07-19 DIAGNOSIS — E785 Hyperlipidemia, unspecified: Secondary | ICD-10-CM | POA: Diagnosis not present

## 2023-07-21 DIAGNOSIS — F329 Major depressive disorder, single episode, unspecified: Secondary | ICD-10-CM | POA: Diagnosis not present

## 2023-07-21 DIAGNOSIS — Z6832 Body mass index (BMI) 32.0-32.9, adult: Secondary | ICD-10-CM | POA: Diagnosis not present

## 2023-07-21 DIAGNOSIS — R1013 Epigastric pain: Secondary | ICD-10-CM | POA: Diagnosis not present

## 2023-07-21 DIAGNOSIS — Z23 Encounter for immunization: Secondary | ICD-10-CM | POA: Diagnosis not present

## 2023-07-21 DIAGNOSIS — Z0001 Encounter for general adult medical examination with abnormal findings: Secondary | ICD-10-CM | POA: Diagnosis not present

## 2023-07-21 DIAGNOSIS — E785 Hyperlipidemia, unspecified: Secondary | ICD-10-CM | POA: Diagnosis not present

## 2023-07-25 DIAGNOSIS — M47816 Spondylosis without myelopathy or radiculopathy, lumbar region: Secondary | ICD-10-CM | POA: Diagnosis not present

## 2023-08-05 DIAGNOSIS — Z8619 Personal history of other infectious and parasitic diseases: Secondary | ICD-10-CM | POA: Diagnosis not present

## 2023-08-05 DIAGNOSIS — Z21 Asymptomatic human immunodeficiency virus [HIV] infection status: Secondary | ICD-10-CM | POA: Diagnosis not present

## 2023-08-05 DIAGNOSIS — M8588 Other specified disorders of bone density and structure, other site: Secondary | ICD-10-CM | POA: Diagnosis not present

## 2023-08-05 DIAGNOSIS — M8580 Other specified disorders of bone density and structure, unspecified site: Secondary | ICD-10-CM | POA: Diagnosis not present

## 2023-08-05 DIAGNOSIS — I10 Essential (primary) hypertension: Secondary | ICD-10-CM | POA: Diagnosis not present

## 2023-08-05 DIAGNOSIS — Z79899 Other long term (current) drug therapy: Secondary | ICD-10-CM | POA: Diagnosis not present

## 2023-08-05 DIAGNOSIS — Z23 Encounter for immunization: Secondary | ICD-10-CM | POA: Diagnosis not present

## 2023-08-23 DIAGNOSIS — Z7289 Other problems related to lifestyle: Secondary | ICD-10-CM | POA: Diagnosis not present

## 2023-08-23 DIAGNOSIS — B20 Human immunodeficiency virus [HIV] disease: Secondary | ICD-10-CM | POA: Diagnosis not present

## 2023-08-23 DIAGNOSIS — R1013 Epigastric pain: Secondary | ICD-10-CM | POA: Diagnosis not present

## 2023-10-28 ENCOUNTER — Other Ambulatory Visit: Payer: Self-pay

## 2023-10-28 ENCOUNTER — Emergency Department (HOSPITAL_COMMUNITY): Payer: No Typology Code available for payment source

## 2023-10-28 ENCOUNTER — Emergency Department (HOSPITAL_COMMUNITY)
Admission: EM | Admit: 2023-10-28 | Discharge: 2023-10-29 | Disposition: A | Discharge status: Home / Self Care | Payer: No Typology Code available for payment source | Attending: Emergency Medicine | Admitting: Emergency Medicine

## 2023-10-28 DIAGNOSIS — N179 Acute kidney failure, unspecified: Secondary | ICD-10-CM | POA: Diagnosis not present

## 2023-10-28 DIAGNOSIS — E876 Hypokalemia: Secondary | ICD-10-CM | POA: Insufficient documentation

## 2023-10-28 DIAGNOSIS — R55 Syncope and collapse: Secondary | ICD-10-CM | POA: Insufficient documentation

## 2023-10-28 DIAGNOSIS — Z21 Asymptomatic human immunodeficiency virus [HIV] infection status: Secondary | ICD-10-CM | POA: Insufficient documentation

## 2023-10-28 DIAGNOSIS — R109 Unspecified abdominal pain: Secondary | ICD-10-CM | POA: Diagnosis not present

## 2023-10-28 DIAGNOSIS — K5090 Crohn's disease, unspecified, without complications: Secondary | ICD-10-CM | POA: Diagnosis not present

## 2023-10-28 DIAGNOSIS — I10 Essential (primary) hypertension: Secondary | ICD-10-CM | POA: Diagnosis not present

## 2023-10-28 DIAGNOSIS — F17290 Nicotine dependence, other tobacco product, uncomplicated: Secondary | ICD-10-CM | POA: Diagnosis not present

## 2023-10-28 DIAGNOSIS — R0781 Pleurodynia: Secondary | ICD-10-CM | POA: Diagnosis not present

## 2023-10-28 DIAGNOSIS — Z041 Encounter for examination and observation following transport accident: Secondary | ICD-10-CM | POA: Diagnosis not present

## 2023-10-28 HISTORY — DX: Asymptomatic human immunodeficiency virus (hiv) infection status: Z21

## 2023-10-28 LAB — CBC WITH DIFFERENTIAL/PLATELET
Abs Immature Granulocytes: 0.02 10*3/uL (ref 0.00–0.07)
Basophils Absolute: 0 10*3/uL (ref 0.0–0.1)
Basophils Relative: 0 %
Eosinophils Absolute: 0.1 10*3/uL (ref 0.0–0.5)
Eosinophils Relative: 1 %
HCT: 41.8 % (ref 39.0–52.0)
Hemoglobin: 13.9 g/dL (ref 13.0–17.0)
Immature Granulocytes: 0 %
Lymphocytes Relative: 48 %
Lymphs Abs: 3.7 10*3/uL (ref 0.7–4.0)
MCH: 29.1 pg (ref 26.0–34.0)
MCHC: 33.3 g/dL (ref 30.0–36.0)
MCV: 87.4 fL (ref 80.0–100.0)
Monocytes Absolute: 1 10*3/uL (ref 0.1–1.0)
Monocytes Relative: 14 %
Neutro Abs: 2.8 10*3/uL (ref 1.7–7.7)
Neutrophils Relative %: 37 %
Platelets: 236 10*3/uL (ref 150–400)
RBC: 4.78 MIL/uL (ref 4.22–5.81)
RDW: 13 % (ref 11.5–15.5)
WBC: 7.6 10*3/uL (ref 4.0–10.5)
nRBC: 0 % (ref 0.0–0.2)

## 2023-10-28 LAB — COMPREHENSIVE METABOLIC PANEL
ALT: 11 U/L (ref 0–44)
AST: 16 U/L (ref 15–41)
Albumin: 3.6 g/dL (ref 3.5–5.0)
Alkaline Phosphatase: 55 U/L (ref 38–126)
Anion gap: 11 (ref 5–15)
BUN: 9 mg/dL (ref 6–20)
CO2: 22 mmol/L (ref 22–32)
Calcium: 8.9 mg/dL (ref 8.9–10.3)
Chloride: 104 mmol/L (ref 98–111)
Creatinine, Ser: 1.86 mg/dL — ABNORMAL HIGH (ref 0.61–1.24)
GFR, Estimated: 46 mL/min — ABNORMAL LOW (ref >60)
Glucose, Bld: 89 mg/dL (ref 70–99)
Potassium: 3.3 mmol/L — ABNORMAL LOW (ref 3.5–5.1)
Sodium: 137 mmol/L (ref 135–145)
Total Bilirubin: 0.6 mg/dL (ref <1.2)
Total Protein: 7.1 g/dL (ref 6.5–8.1)

## 2023-10-28 NOTE — ED Triage Notes (Signed)
Patient reports seizures this week , Wednesday and today [facility] today . Alert and oriented at triage , respirations unlabored .
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
## 2023-10-29 ENCOUNTER — Emergency Department (HOSPITAL_COMMUNITY): Payer: No Typology Code available for payment source

## 2023-10-29 ENCOUNTER — Encounter (HOSPITAL_COMMUNITY): Payer: Self-pay | Admitting: *Deleted

## 2023-10-29 DIAGNOSIS — R109 Unspecified abdominal pain: Secondary | ICD-10-CM | POA: Diagnosis not present

## 2023-10-29 DIAGNOSIS — K5090 Crohn's disease, unspecified, without complications: Secondary | ICD-10-CM | POA: Diagnosis not present

## 2023-10-29 LAB — MAGNESIUM: Magnesium: 1.9 mg/dL (ref 1.7–2.4)

## 2023-10-29 MED ORDER — POTASSIUM CHLORIDE CRYS ER 20 MEQ PO TBCR
20.0000 meq | EXTENDED_RELEASE_TABLET | Freq: Once | ORAL | Status: AC
  Administered 2023-10-29: 20 meq via ORAL
  Filled 2023-10-29: qty 1
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
## 2023-10-29 NOTE — ED Notes (Signed)
ED Provider at bedside. 

## 2023-10-29 NOTE — Discharge Instructions (Signed)
The cause of your episode was not identified today.  It is important for you to drink plenty of fluids.  You were dehydrated on your labs today.  You will need to follow-up with your family doctor in the next week to have a recheck of your kidney function. You had a CT scan performed in the emergency department that showed thickening of your ileum.  This is part of your colon.  It will need to be further evaluated by your doctor.  You cannot drive or operate heavy machinery for the next 6 months or until cleared by your doctor for this spell that was a possible seizure or syncopal event.

## 2023-11-01 DIAGNOSIS — G40909 Epilepsy, unspecified, not intractable, without status epilepticus: Secondary | ICD-10-CM | POA: Diagnosis not present

## 2023-11-01 DIAGNOSIS — R935 Abnormal findings on diagnostic imaging of other abdominal regions, including retroperitoneum: Secondary | ICD-10-CM | POA: Diagnosis not present

## 2023-11-01 DIAGNOSIS — F329 Major depressive disorder, single episode, unspecified: Secondary | ICD-10-CM | POA: Diagnosis not present

## 2023-11-01 DIAGNOSIS — N179 Acute kidney failure, unspecified: Secondary | ICD-10-CM | POA: Diagnosis not present

## 2023-11-03 DIAGNOSIS — R55 Syncope and collapse: Secondary | ICD-10-CM | POA: Diagnosis not present

## 2023-11-03 DIAGNOSIS — R935 Abnormal findings on diagnostic imaging of other abdominal regions, including retroperitoneum: Secondary | ICD-10-CM | POA: Diagnosis not present

## 2023-11-03 DIAGNOSIS — E878 Other disorders of electrolyte and fluid balance, not elsewhere classified: Secondary | ICD-10-CM | POA: Diagnosis not present

## 2023-11-03 DIAGNOSIS — I10 Essential (primary) hypertension: Secondary | ICD-10-CM | POA: Diagnosis not present

## 2023-11-03 DIAGNOSIS — E785 Hyperlipidemia, unspecified: Secondary | ICD-10-CM | POA: Diagnosis not present

## 2023-11-11 DIAGNOSIS — N179 Acute kidney failure, unspecified: Secondary | ICD-10-CM | POA: Diagnosis not present

## 2023-11-16 DIAGNOSIS — B20 Human immunodeficiency virus [HIV] disease: Secondary | ICD-10-CM | POA: Diagnosis not present

## 2023-11-16 DIAGNOSIS — Z8619 Personal history of other infectious and parasitic diseases: Secondary | ICD-10-CM | POA: Diagnosis not present

## 2023-11-16 DIAGNOSIS — E559 Vitamin D deficiency, unspecified: Secondary | ICD-10-CM | POA: Diagnosis not present

## 2023-11-16 DIAGNOSIS — M8588 Other specified disorders of bone density and structure, other site: Secondary | ICD-10-CM | POA: Diagnosis not present

## 2023-11-21 DIAGNOSIS — Z800 Family history of malignant neoplasm of digestive organs: Secondary | ICD-10-CM | POA: Diagnosis not present

## 2023-11-21 DIAGNOSIS — K5730 Diverticulosis of large intestine without perforation or abscess without bleeding: Secondary | ICD-10-CM | POA: Diagnosis not present

## 2023-11-21 DIAGNOSIS — B20 Human immunodeficiency virus [HIV] disease: Secondary | ICD-10-CM | POA: Diagnosis not present

## 2023-11-21 DIAGNOSIS — K200 Eosinophilic esophagitis: Secondary | ICD-10-CM | POA: Diagnosis not present

## 2023-11-30 DIAGNOSIS — F419 Anxiety disorder, unspecified: Secondary | ICD-10-CM | POA: Diagnosis not present

## 2023-11-30 DIAGNOSIS — G4089 Other seizures: Secondary | ICD-10-CM | POA: Diagnosis not present

## 2023-12-26 DIAGNOSIS — G4089 Other seizures: Secondary | ICD-10-CM | POA: Diagnosis not present

## 2023-12-26 DIAGNOSIS — N179 Acute kidney failure, unspecified: Secondary | ICD-10-CM | POA: Diagnosis not present

## 2023-12-26 DIAGNOSIS — R935 Abnormal findings on diagnostic imaging of other abdominal regions, including retroperitoneum: Secondary | ICD-10-CM | POA: Diagnosis not present

## 2023-12-26 DIAGNOSIS — B20 Human immunodeficiency virus [HIV] disease: Secondary | ICD-10-CM | POA: Diagnosis not present

## 2023-12-26 DIAGNOSIS — G40909 Epilepsy, unspecified, not intractable, without status epilepticus: Secondary | ICD-10-CM | POA: Diagnosis not present

## 2023-12-27 ENCOUNTER — Ambulatory Visit (HOSPITAL_COMMUNITY)
Admission: RE | Admit: 2023-12-27 | Discharge: 2023-12-27 | Disposition: A | Discharge status: Home / Self Care | Payer: No Typology Code available for payment source | Source: Ambulatory Visit | Attending: Internal Medicine | Admitting: Internal Medicine

## 2023-12-27 DIAGNOSIS — R569 Unspecified convulsions: Secondary | ICD-10-CM | POA: Diagnosis not present

## 2023-12-27 NOTE — Progress Notes (Signed)
 EEG complete - results pending

## 2023-12-27 NOTE — Procedures (Signed)
 Patient Name: Wesley Hill  MRN: [id number]  Epilepsy Attending: Charlsie Quest  Referring Physician/Provider: Rema Jasmine, MD  Date: 12/27/2023 Duration: 31.55 mins  Patient history: 43yo M getting eeg to evaluate for seizure  Level of alertness: Awake, asleep  AEDs during EEG study: LEV  Technical aspects: This EEG study was done with scalp electrodes positioned according to the 10-20 International system of electrode placement. Electrical activity was reviewed with band pass filter of 1-70Hz , sensitivity of 7 uV/mm, display speed of 70mm/sec with a 60Hz  notched filter applied as appropriate. EEG data were recorded continuously and digitally stored.  Video monitoring was available and reviewed as appropriate.  Description: The posterior dominant rhythm consists of 8-9 Hz activity of moderate voltage (25-35 uV) seen predominantly in posterior head regions, symmetric and reactive to eye opening and eye closing. Sleep was characterized by vertex waves, sleep spindles (12 to 14 Hz), maximal frontocentral region. Physiologic photic driving was not seen during photic stimulation.  Hyperventilation was not performed.     IMPRESSION: This study is within normal limits. No seizures or epileptiform discharges were seen throughout the recording.  A normal interictal EEG does not exclude the diagnosis of epilepsy.  [name]

## 2023-12-28 ENCOUNTER — Ambulatory Visit (HOSPITAL_COMMUNITY): Payer: No Typology Code available for payment source

## 2023-12-28 DIAGNOSIS — I081 Rheumatic disorders of both mitral and tricuspid valves: Secondary | ICD-10-CM | POA: Diagnosis not present

## 2023-12-28 DIAGNOSIS — R55 Syncope and collapse: Secondary | ICD-10-CM | POA: Diagnosis not present

## 2024-01-13 DIAGNOSIS — R933 Abnormal findings on diagnostic imaging of other parts of digestive tract: Secondary | ICD-10-CM | POA: Diagnosis not present

## 2024-01-13 DIAGNOSIS — K648 Other hemorrhoids: Secondary | ICD-10-CM | POA: Diagnosis not present

## 2024-01-13 DIAGNOSIS — K259 Gastric ulcer, unspecified as acute or chronic, without hemorrhage or perforation: Secondary | ICD-10-CM | POA: Diagnosis not present

## 2024-01-13 DIAGNOSIS — K31819 Angiodysplasia of stomach and duodenum without bleeding: Secondary | ICD-10-CM | POA: Diagnosis not present

## 2024-01-13 DIAGNOSIS — Z800 Family history of malignant neoplasm of digestive organs: Secondary | ICD-10-CM | POA: Diagnosis not present

## 2024-01-13 DIAGNOSIS — K5730 Diverticulosis of large intestine without perforation or abscess without bleeding: Secondary | ICD-10-CM | POA: Diagnosis not present

## 2024-01-18 DIAGNOSIS — F4310 Post-traumatic stress disorder, unspecified: Secondary | ICD-10-CM | POA: Diagnosis not present

## 2024-01-18 DIAGNOSIS — E785 Hyperlipidemia, unspecified: Secondary | ICD-10-CM | POA: Diagnosis not present

## 2024-01-18 DIAGNOSIS — R935 Abnormal findings on diagnostic imaging of other abdominal regions, including retroperitoneum: Secondary | ICD-10-CM | POA: Diagnosis not present

## 2024-01-18 DIAGNOSIS — I10 Essential (primary) hypertension: Secondary | ICD-10-CM | POA: Diagnosis not present

## 2024-01-20 DIAGNOSIS — E785 Hyperlipidemia, unspecified: Secondary | ICD-10-CM | POA: Diagnosis not present

## 2024-01-25 DIAGNOSIS — N182 Chronic kidney disease, stage 2 (mild): Secondary | ICD-10-CM | POA: Diagnosis not present

## 2024-01-25 DIAGNOSIS — G40909 Epilepsy, unspecified, not intractable, without status epilepticus: Secondary | ICD-10-CM | POA: Diagnosis not present

## 2024-01-25 DIAGNOSIS — R935 Abnormal findings on diagnostic imaging of other abdominal regions, including retroperitoneum: Secondary | ICD-10-CM | POA: Diagnosis not present

## 2024-01-25 DIAGNOSIS — E785 Hyperlipidemia, unspecified: Secondary | ICD-10-CM | POA: Diagnosis not present

## 2024-01-27 DIAGNOSIS — F419 Anxiety disorder, unspecified: Secondary | ICD-10-CM | POA: Diagnosis not present

## 2024-01-27 DIAGNOSIS — G4089 Other seizures: Secondary | ICD-10-CM | POA: Diagnosis not present

## 2024-02-14 DIAGNOSIS — R6889 Other general symptoms and signs: Secondary | ICD-10-CM | POA: Diagnosis not present

## 2024-02-15 DIAGNOSIS — R55 Syncope and collapse: Secondary | ICD-10-CM | POA: Diagnosis not present

## 2024-02-15 DIAGNOSIS — Z79899 Other long term (current) drug therapy: Secondary | ICD-10-CM | POA: Diagnosis not present

## 2024-02-15 DIAGNOSIS — B20 Human immunodeficiency virus [HIV] disease: Secondary | ICD-10-CM | POA: Diagnosis not present

## 2024-02-15 DIAGNOSIS — E559 Vitamin D deficiency, unspecified: Secondary | ICD-10-CM | POA: Diagnosis not present

## 2024-02-15 DIAGNOSIS — Z8619 Personal history of other infectious and parasitic diseases: Secondary | ICD-10-CM | POA: Diagnosis not present

## 2024-02-15 DIAGNOSIS — M8588 Other specified disorders of bone density and structure, other site: Secondary | ICD-10-CM | POA: Diagnosis not present

## 2024-02-21 DIAGNOSIS — A519 Early syphilis, unspecified: Secondary | ICD-10-CM | POA: Diagnosis not present

## 2024-04-26 DIAGNOSIS — G4089 Other seizures: Secondary | ICD-10-CM | POA: Diagnosis not present

## 2024-04-26 DIAGNOSIS — F419 Anxiety disorder, unspecified: Secondary | ICD-10-CM | POA: Diagnosis not present

## 2024-04-27 DIAGNOSIS — E785 Hyperlipidemia, unspecified: Secondary | ICD-10-CM | POA: Diagnosis not present

## 2024-04-27 DIAGNOSIS — G40909 Epilepsy, unspecified, not intractable, without status epilepticus: Secondary | ICD-10-CM | POA: Diagnosis not present

## 2024-04-27 DIAGNOSIS — Z131 Encounter for screening for diabetes mellitus: Secondary | ICD-10-CM | POA: Diagnosis not present

## 2024-04-27 DIAGNOSIS — N179 Acute kidney failure, unspecified: Secondary | ICD-10-CM | POA: Diagnosis not present

## 2024-04-27 DIAGNOSIS — E559 Vitamin D deficiency, unspecified: Secondary | ICD-10-CM | POA: Diagnosis not present

## 2024-05-11 DIAGNOSIS — E785 Hyperlipidemia, unspecified: Secondary | ICD-10-CM | POA: Diagnosis not present

## 2024-05-11 DIAGNOSIS — M25561 Pain in right knee: Secondary | ICD-10-CM | POA: Diagnosis not present

## 2024-05-11 DIAGNOSIS — F411 Generalized anxiety disorder: Secondary | ICD-10-CM | POA: Diagnosis not present

## 2024-05-11 DIAGNOSIS — G40909 Epilepsy, unspecified, not intractable, without status epilepticus: Secondary | ICD-10-CM | POA: Diagnosis not present

## 2024-05-11 DIAGNOSIS — Z0001 Encounter for general adult medical examination with abnormal findings: Secondary | ICD-10-CM | POA: Diagnosis not present

## 2024-05-11 DIAGNOSIS — Z23 Encounter for immunization: Secondary | ICD-10-CM | POA: Diagnosis not present

## 2024-05-11 DIAGNOSIS — F329 Major depressive disorder, single episode, unspecified: Secondary | ICD-10-CM | POA: Diagnosis not present

## 2025-07-30 ENCOUNTER — Ambulatory Visit: Admitting: Physician Assistant
